# Patient Record
Sex: Male | Born: 1965 | Race: Black or African American | Hispanic: No | Marital: Married | Smoking: Never smoker
Health system: Southern US, Community
[De-identification: ages and names within clinical notes are randomized; demographics above are authoritative.]

## PROBLEM LIST (undated history)

## (undated) DIAGNOSIS — E785 Hyperlipidemia, unspecified: Secondary | ICD-10-CM

## (undated) DIAGNOSIS — R200 Anesthesia of skin: Secondary | ICD-10-CM

## (undated) DIAGNOSIS — G44309 Post-traumatic headache, unspecified, not intractable: Secondary | ICD-10-CM

## (undated) DIAGNOSIS — M5412 Radiculopathy, cervical region: Secondary | ICD-10-CM

## (undated) DIAGNOSIS — H02401 Unspecified ptosis of right eyelid: Secondary | ICD-10-CM

## (undated) HISTORY — DX: Radiculopathy, cervical region: M54.12

## (undated) HISTORY — DX: Unspecified ptosis of right eyelid: H02.401

## (undated) HISTORY — DX: Hyperlipidemia, unspecified: E78.5

## (undated) HISTORY — DX: Post-traumatic headache, unspecified, not intractable: G44.309

## (undated) HISTORY — DX: Anesthesia of skin: R20.0

---

## 2006-07-17 ENCOUNTER — Ambulatory Visit: Payer: Self-pay | Admitting: Family Medicine

## 2006-07-19 ENCOUNTER — Ambulatory Visit: Payer: Self-pay | Admitting: Family Medicine

## 2006-07-27 ENCOUNTER — Ambulatory Visit: Payer: Self-pay | Admitting: Family Medicine

## 2008-12-09 ENCOUNTER — Emergency Department (HOSPITAL_COMMUNITY): Admission: EM | Admit: 2008-12-09 | Discharge: 2008-12-09 | Payer: Self-pay | Admitting: Emergency Medicine

## 2013-01-14 ENCOUNTER — Encounter: Payer: Self-pay | Admitting: Medical

## 2013-01-20 ENCOUNTER — Encounter: Payer: Self-pay | Admitting: Family Medicine

## 2016-05-24 DIAGNOSIS — Z719 Counseling, unspecified: Secondary | ICD-10-CM | POA: Diagnosis not present

## 2016-05-31 DIAGNOSIS — Z719 Counseling, unspecified: Secondary | ICD-10-CM | POA: Diagnosis not present

## 2016-06-07 DIAGNOSIS — Z719 Counseling, unspecified: Secondary | ICD-10-CM | POA: Diagnosis not present

## 2016-06-14 DIAGNOSIS — Z719 Counseling, unspecified: Secondary | ICD-10-CM | POA: Diagnosis not present

## 2016-06-21 DIAGNOSIS — Z719 Counseling, unspecified: Secondary | ICD-10-CM | POA: Diagnosis not present

## 2016-08-09 DIAGNOSIS — Z719 Counseling, unspecified: Secondary | ICD-10-CM | POA: Diagnosis not present

## 2016-08-16 DIAGNOSIS — Z719 Counseling, unspecified: Secondary | ICD-10-CM | POA: Diagnosis not present

## 2016-09-06 DIAGNOSIS — Z719 Counseling, unspecified: Secondary | ICD-10-CM | POA: Diagnosis not present

## 2016-09-13 DIAGNOSIS — Z719 Counseling, unspecified: Secondary | ICD-10-CM | POA: Diagnosis not present

## 2020-10-04 ENCOUNTER — Emergency Department (HOSPITAL_BASED_OUTPATIENT_CLINIC_OR_DEPARTMENT_OTHER): Payer: Self-pay | Admitting: Radiology

## 2020-10-04 ENCOUNTER — Encounter (HOSPITAL_BASED_OUTPATIENT_CLINIC_OR_DEPARTMENT_OTHER): Payer: Self-pay | Admitting: Emergency Medicine

## 2020-10-04 ENCOUNTER — Emergency Department (HOSPITAL_BASED_OUTPATIENT_CLINIC_OR_DEPARTMENT_OTHER): Payer: Self-pay

## 2020-10-04 ENCOUNTER — Emergency Department (HOSPITAL_BASED_OUTPATIENT_CLINIC_OR_DEPARTMENT_OTHER)
Admission: EM | Admit: 2020-10-04 | Discharge: 2020-10-04 | Disposition: A | Discharge status: Home / Self Care | Payer: Self-pay | Attending: Emergency Medicine | Admitting: Emergency Medicine

## 2020-10-04 ENCOUNTER — Other Ambulatory Visit: Payer: Self-pay

## 2020-10-04 DIAGNOSIS — S29019A Strain of muscle and tendon of unspecified wall of thorax, initial encounter: Secondary | ICD-10-CM | POA: Insufficient documentation

## 2020-10-04 DIAGNOSIS — Y92410 Unspecified street and highway as the place of occurrence of the external cause: Secondary | ICD-10-CM | POA: Diagnosis not present

## 2020-10-04 DIAGNOSIS — S29011A Strain of muscle and tendon of front wall of thorax, initial encounter: Secondary | ICD-10-CM

## 2020-10-04 DIAGNOSIS — S161XXA Strain of muscle, fascia and tendon at neck level, initial encounter: Secondary | ICD-10-CM | POA: Diagnosis not present

## 2020-10-04 DIAGNOSIS — M25511 Pain in right shoulder: Secondary | ICD-10-CM | POA: Insufficient documentation

## 2020-10-04 DIAGNOSIS — R519 Headache, unspecified: Secondary | ICD-10-CM | POA: Insufficient documentation

## 2020-10-04 DIAGNOSIS — S199XXA Unspecified injury of neck, initial encounter: Secondary | ICD-10-CM | POA: Diagnosis present
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
## 2020-10-04 NOTE — Discharge Instructions (Addendum)
If you develop headache, new or worsening neck pain, new or worsening chest pain, shortness of breath, weakness or numbness, or any other new/concerning symptoms the return to the ER for evaluation.
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
## 2020-10-11 ENCOUNTER — Encounter (HOSPITAL_BASED_OUTPATIENT_CLINIC_OR_DEPARTMENT_OTHER): Payer: Self-pay | Admitting: *Deleted

## 2020-10-11 ENCOUNTER — Other Ambulatory Visit: Payer: Self-pay

## 2020-10-11 ENCOUNTER — Emergency Department (HOSPITAL_BASED_OUTPATIENT_CLINIC_OR_DEPARTMENT_OTHER)
Admission: EM | Admit: 2020-10-11 | Discharge: 2020-10-12 | Disposition: A | Discharge status: Home / Self Care | Payer: Self-pay | Attending: Emergency Medicine | Admitting: Emergency Medicine

## 2020-10-11 ENCOUNTER — Emergency Department (HOSPITAL_COMMUNITY): Payer: Self-pay

## 2020-10-11 DIAGNOSIS — R202 Paresthesia of skin: Secondary | ICD-10-CM | POA: Insufficient documentation

## 2020-10-11 DIAGNOSIS — M6281 Muscle weakness (generalized): Secondary | ICD-10-CM | POA: Insufficient documentation

## 2020-10-11 DIAGNOSIS — R200 Anesthesia of skin: Secondary | ICD-10-CM | POA: Insufficient documentation

## 2020-10-11 DIAGNOSIS — R519 Headache, unspecified: Secondary | ICD-10-CM | POA: Insufficient documentation

## 2020-10-11 DIAGNOSIS — M542 Cervicalgia: Secondary | ICD-10-CM | POA: Insufficient documentation
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
## 2020-10-11 MED ORDER — GADOBUTROL 1 MMOL/ML IV SOLN
10.0000 mL | Freq: Once | INTRAVENOUS | Status: AC | PRN
  Administered 2020-10-11: 10 mL via INTRAVENOUS

## 2020-10-11 MED ORDER — LIDOCAINE 5 % EX PTCH
1.0000 | MEDICATED_PATCH | Freq: Once | CUTANEOUS | Status: DC
  Administered 2020-10-12: 1 via TRANSDERMAL
  Filled 2020-10-11: qty 1

## 2020-10-11 MED ORDER — ACETAMINOPHEN 500 MG PO TABS
1000.0000 mg | ORAL_TABLET | Freq: Once | ORAL | Status: DC
  Filled 2020-10-11: qty 2

## 2020-10-11 MED ORDER — LIDOCAINE 5 % EX PTCH: 1.0000 | MEDICATED_PATCH | CUTANEOUS | 0 refills | Status: DC

## 2020-10-11 NOTE — Discharge Instructions (Addendum)
Call [phone] to follow up with Dr. Danielle Dess at [facility].  In the meantime, take Tylenol and Motrin alternating every 6 hours for pain.  Apply lidocaine patch to neck to help with pain, replace every 24 hours  Return to the Ed for new or worsening symptoms including worsening weakness.
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
## 2020-10-12 ENCOUNTER — Encounter (HOSPITAL_BASED_OUTPATIENT_CLINIC_OR_DEPARTMENT_OTHER): Payer: Self-pay | Admitting: Emergency Medicine

## 2020-11-10 ENCOUNTER — Other Ambulatory Visit: Payer: Self-pay | Admitting: Internal Medicine

## 2020-11-10 DIAGNOSIS — G519 Disorder of facial nerve, unspecified: Secondary | ICD-10-CM

## 2020-12-24 ENCOUNTER — Ambulatory Visit
Admission: RE | Admit: 2020-12-24 | Discharge: 2020-12-24 | Disposition: A | Discharge status: Home / Self Care | Payer: No Typology Code available for payment source | Source: Ambulatory Visit | Attending: Internal Medicine | Admitting: Internal Medicine

## 2020-12-24 DIAGNOSIS — G519 Disorder of facial nerve, unspecified: Secondary | ICD-10-CM
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
## 2020-12-24 MED ORDER — GADOBENATE DIMEGLUMINE 529 MG/ML IV SOLN
20.0000 mL | Freq: Once | INTRAVENOUS | Status: AC | PRN
  Administered 2020-12-24: 20 mL via INTRAVENOUS

## 2021-01-20 ENCOUNTER — Encounter: Payer: Self-pay | Admitting: *Deleted

## 2021-01-25 ENCOUNTER — Encounter: Payer: Self-pay | Admitting: Diagnostic Neuroimaging

## 2021-01-25 ENCOUNTER — Ambulatory Visit (INDEPENDENT_AMBULATORY_CARE_PROVIDER_SITE_OTHER): Payer: 59 | Admitting: Diagnostic Neuroimaging

## 2021-01-25 VITALS — BP 133/85 | HR 76 | Ht 72.0 in | Wt 278.0 lb

## 2021-01-25 DIAGNOSIS — G44329 Chronic post-traumatic headache, not intractable: Secondary | ICD-10-CM

## 2021-01-25 DIAGNOSIS — H0011 Chalazion right upper eyelid: Secondary | ICD-10-CM | POA: Diagnosis not present

## 2021-01-25 DIAGNOSIS — R200 Anesthesia of skin: Secondary | ICD-10-CM | POA: Diagnosis not present

## 2021-01-25 NOTE — Patient Instructions (Signed)
°  POST-TRAUMATIC HEADACHES - ibuprofen, tylenol as needed  POST-TRAUMATIC RIGHT FACIAL NUMBNESS - monitor; supportive care  POST-TRAUMATIC HAND NUMBNESS - monitor; supportive care  RIGHT EYELID PTOSIS (plus eyelid cyst; ? Post-traumatic chalazion) - follow up with ophthalmology
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
## 2021-12-27 ENCOUNTER — Encounter (HOSPITAL_BASED_OUTPATIENT_CLINIC_OR_DEPARTMENT_OTHER): Payer: Self-pay | Admitting: Emergency Medicine

## 2021-12-27 ENCOUNTER — Other Ambulatory Visit: Payer: Self-pay

## 2021-12-27 ENCOUNTER — Emergency Department (HOSPITAL_BASED_OUTPATIENT_CLINIC_OR_DEPARTMENT_OTHER)
Admission: EM | Admit: 2021-12-27 | Discharge: 2021-12-27 | Disposition: A | Discharge status: Home / Self Care | Payer: Managed Care, Other (non HMO) | Attending: Emergency Medicine | Admitting: Emergency Medicine

## 2021-12-27 DIAGNOSIS — L0201 Cutaneous abscess of face: Secondary | ICD-10-CM | POA: Insufficient documentation

## 2021-12-27 DIAGNOSIS — Z23 Encounter for immunization: Secondary | ICD-10-CM | POA: Diagnosis not present

## 2021-12-27 DIAGNOSIS — R220 Localized swelling, mass and lump, head: Secondary | ICD-10-CM | POA: Diagnosis present

## 2021-12-27 LAB — CBC WITH DIFFERENTIAL/PLATELET
Abs Immature Granulocytes: 0.03 10*3/uL (ref 0.00–0.07)
Basophils Absolute: 0 10*3/uL (ref 0.0–0.1)
Basophils Relative: 0 %
Eosinophils Absolute: 0.1 10*3/uL (ref 0.0–0.5)
Eosinophils Relative: 1 %
HCT: 39.4 % (ref 39.0–52.0)
Hemoglobin: 14.2 g/dL (ref 13.0–17.0)
Immature Granulocytes: 0 %
Lymphocytes Relative: 32 %
Lymphs Abs: 2.2 10*3/uL (ref 0.7–4.0)
MCH: 30.8 pg (ref 26.0–34.0)
MCHC: 36 g/dL (ref 30.0–36.0)
MCV: 85.5 fL (ref 80.0–100.0)
Monocytes Absolute: 0.3 10*3/uL (ref 0.1–1.0)
Monocytes Relative: 5 %
Neutro Abs: 4.1 10*3/uL (ref 1.7–7.7)
Neutrophils Relative %: 62 %
Platelets: 223 10*3/uL (ref 150–400)
RBC: 4.61 MIL/uL (ref 4.22–5.81)
RDW: 11.7 % (ref 11.5–15.5)
WBC: 6.7 10*3/uL (ref 4.0–10.5)
nRBC: 0 % (ref 0.0–0.2)

## 2021-12-27 LAB — COMPREHENSIVE METABOLIC PANEL
ALT: 23 U/L (ref 0–44)
AST: 20 U/L (ref 15–41)
Albumin: 4 g/dL (ref 3.5–5.0)
Alkaline Phosphatase: 49 U/L (ref 38–126)
Anion gap: 8 (ref 5–15)
BUN: 12 mg/dL (ref 6–20)
CO2: 25 mmol/L (ref 22–32)
Calcium: 8.9 mg/dL (ref 8.9–10.3)
Chloride: 104 mmol/L (ref 98–111)
Creatinine, Ser: 1.03 mg/dL (ref 0.61–1.24)
GFR, Estimated: >60 mL/min (ref >60)
Glucose, Bld: 295 mg/dL — ABNORMAL HIGH (ref 70–99)
Potassium: 4.2 mmol/L (ref 3.5–5.1)
Sodium: 137 mmol/L (ref 135–145)
Total Bilirubin: 0.9 mg/dL (ref 0.3–1.2)
Total Protein: 7 g/dL (ref 6.5–8.1)

## 2021-12-27 MED ORDER — IBUPROFEN 600 MG PO TABS: 600.0000 mg | ORAL_TABLET | Freq: Four times a day (QID) | ORAL | 0 refills | Status: AC | PRN

## 2021-12-27 MED ORDER — LIDOCAINE-EPINEPHRINE (PF) 2 %-1:200000 IJ SOLN
10.0000 mL | Freq: Once | INTRAMUSCULAR | Status: AC
  Administered 2021-12-27: 10 mL via INTRADERMAL
  Filled 2021-12-27: qty 20

## 2021-12-27 MED ORDER — DOXYCYCLINE HYCLATE 100 MG PO CAPS: 100.0000 mg | ORAL_CAPSULE | Freq: Two times a day (BID) | ORAL | 0 refills | Status: AC

## 2021-12-27 MED ORDER — TETANUS-DIPHTH-ACELL PERTUSSIS 5-2.5-18.5 LF-MCG/0.5 IM SUSY
0.5000 mL | PREFILLED_SYRINGE | Freq: Once | INTRAMUSCULAR | Status: AC
  Administered 2021-12-27: 0.5 mL via INTRAMUSCULAR
  Filled 2021-12-27: qty 0.5

## 2021-12-27 NOTE — Discharge Instructions (Addendum)
You have a facial abscess that was incised and drained in the ER.  Please apply ice warm moist compress to affected area several times daily to help aid with healing.  Take antibiotic as prescribed.  If you develop worsening pain or swelling or if you have any other concern, please return for wound recheck in 48 hours.

## 2021-12-27 NOTE — ED Provider Notes (Signed)
[facility] EMERGENCY DEPT Provider Note   CSN: [id number] Arrival date & time: 12/27/21  1745     History  Chief Complaint  Patient presents with   Facial Swelling    Jose Ford is a 56 y.o. male.  The history is provided by the patient and medical records. No language interpreter was used.     56 year old male significant history of recurrent facial numbness presenting complaining of facial swelling.  Patient report for the past 5 days he noticed swelling and tenderness to his left lower jaw.  Tenderness is mild.  He denies any specific injury.  There is no associated fever or chills no dental pain no trouble chewing and no neck pain.  He is unsure of his last tetanus status.  He denies any specific treatment tried.  He does not endorse any cold symptoms.  No complaint of dry mouth.  Home Medications Prior to Admission medications   Not on File      Allergies    Patient has no known allergies.    Review of Systems   Review of Systems  All other systems reviewed and are negative.   Physical Exam Updated Vital Signs BP 115/85   Pulse 74   Temp 97.6 F (36.4 C) (Temporal)   Resp 18   Ht 6' (1.829 m)   Wt 110.7 kg   SpO2 98%   BMI 33.09 kg/m  Physical Exam Vitals and nursing note reviewed.  Constitutional:      General: He is not in acute distress.    Appearance: He is well-developed.  HENT:     Head: Atraumatic.     Comments: Face: There is an area of induration and fluctuant noted to the left angle of the mandible with mild tenderness to palpation but no erythema or warmth appreciated.  No dental involvement.  No malocclusion.  No lymphadenopathy. Eyes:     Conjunctiva/sclera: Conjunctivae normal.  Neck:     Vascular: No carotid bruit.  Musculoskeletal:     Cervical back: Normal range of motion and neck supple. No rigidity or tenderness.  Lymphadenopathy:     Cervical: No cervical adenopathy.  Skin:    Findings: No rash.   Neurological:     Mental Status: He is alert.     ED Results / Procedures / Treatments   Labs (all labs ordered are listed, but only abnormal results are displayed) Labs Reviewed  COMPREHENSIVE METABOLIC PANEL - Abnormal; Notable for the following components:      Result Value   Glucose, Bld 295 (*)    All other components within normal limits  CBC WITH DIFFERENTIAL/PLATELET    EKG None  Radiology No results found.  Procedures .Marland KitchenIncision and Drainage  Date/Time: 12/27/2021 8:28 PM  Performed by: Fayrene Helper, PA-C Authorized by: Fayrene Helper, PA-C   Consent:    Consent obtained:  Verbal   Consent given by:  Patient   Risks discussed:  Bleeding, incomplete drainage, pain and damage to other organs   Alternatives discussed:  No treatment Universal protocol:    Procedure explained and questions answered to patient or proxy's satisfaction: yes     Relevant documents present and verified: yes     Test results available : yes     Imaging studies available: yes     Required blood products, implants, devices, and special equipment available: yes     Site/side marked: yes     Immediately prior to procedure, a time out was called: yes  Patient identity confirmed:  Verbally with patient Location:    Type:  Abscess   Size:  4cm   Location:  Head   Head location:  Face Pre-procedure details:    Skin preparation:  Betadine Anesthesia:    Anesthesia method:  Local infiltration   Local anesthetic:  Lidocaine 2% WITH epi Procedure type:    Complexity:  Complex Procedure details:    Incision types:  Single straight   Incision depth:  Subcutaneous   Wound management:  Probed and deloculated, irrigated with saline and extensive cleaning   Drainage:  Purulent   Drainage amount:  Moderate   Wound treatment:  Wound left open   Packing materials:  None Post-procedure details:    Procedure completion:  Tolerated well, no immediate complications     Medications Ordered in  ED Medications  lidocaine-EPINEPHrine (XYLOCAINE W/EPI) 2 %-1:200000 (PF) injection 10 mL (10 mLs Intradermal Given 12/27/21 2000)  Tdap (BOOSTRIX) injection 0.5 mL (0.5 mLs Intramuscular Given 12/27/21 1959)    ED Course/ Medical Decision Making/ A&P                           Medical Decision Making Amount and/or Complexity of Data Reviewed Labs: ordered.  Risk Prescription drug management.   BP 115/85   Pulse 74   Temp 97.6 F (36.4 C) (Temporal)   Resp 18   Ht 6' (1.829 m)   Wt 110.7 kg   SpO2 98%   BMI 33.09 kg/m   14:58 PM   56 year old male significant history of recurrent facial numbness presenting complaining of facial swelling.  Patient report for the past 5 days he noticed swelling and tenderness to his left lower jaw.  Tenderness is mild.  He denies any specific injury.  There is no associated fever or chills no dental pain no trouble chewing and no neck pain.  He is unsure of his last tetanus status.  He denies any specific treatment tried.  He does not endorse any cold symptoms.  On exam, patient is resting comfortably in bed appears to be in no acute discomfort.  Patient does have a golf ball size subcutaneous mass noted to his left angle of the jaw with mild tenderness to palpation.  This could be an abscess, versus reactive lymph nodes, versus lipoma versus parotitis.  Low suspicion for dental abscess.  Plan to perform incision and drainage.  Will update tetanus patient overall well-appearing, he is afebrile with stable normal vital signs.  8:29 PM Successful incision and drainage of facial abscess.  Moderate purulent discharge was able to expressed.  Packing was not placed.  Appropriate wound care instruction given.  Will d/c home with abx and return precaution in 48 hrs if no improvement.          Final Clinical Impression(s) / ED Diagnoses Final diagnoses:  Cutaneous abscess of face    Rx / DC Orders ED Discharge Orders          Ordered     doxycycline (VIBRAMYCIN) 100 MG capsule  2 times daily        12/27/21 2032    ibuprofen (ADVIL) 600 MG tablet  Every 6 hours PRN        12/27/21 2032              Fayrene Helper, PA-C 12/27/21 2033    Glyn Ade, MD 12/28/21 [phone]

## 2021-12-27 NOTE — ED Triage Notes (Signed)
Pt arrives to ED with c/o left sided facial swelling that x5 days.
